# Patient Record
Sex: Male | Born: 2000 | Race: White | Hispanic: No | Marital: Single | Smoking: Current every day smoker
Health system: Southern US, Community
[De-identification: ages and names within clinical notes are randomized; demographics above are authoritative.]

---
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
## 2018-10-22 ENCOUNTER — Other Ambulatory Visit: Payer: Self-pay

## 2018-10-25 ENCOUNTER — Ambulatory Visit (INDEPENDENT_AMBULATORY_CARE_PROVIDER_SITE_OTHER): Payer: Medicaid Other | Admitting: Family Medicine

## 2018-10-25 ENCOUNTER — Encounter: Payer: Self-pay | Admitting: Family Medicine

## 2018-10-25 ENCOUNTER — Other Ambulatory Visit: Payer: Self-pay

## 2018-10-25 VITALS — BP 110/68 | HR 80 | Temp 98.4°F | Resp 18 | Ht 68.0 in | Wt 175.0 lb

## 2018-10-25 DIAGNOSIS — L304 Erythema intertrigo: Secondary | ICD-10-CM

## 2018-10-25 MED ORDER — CLOTRIMAZOLE-BETAMETHASONE 1-0.05 % EX CREA: 1.0000 "application " | TOPICAL_CREAM | Freq: Two times a day (BID) | CUTANEOUS | 1 refills | Status: DC

## 2018-10-25 NOTE — Progress Notes (Signed)
Subjective:    Patient ID: Richard Vargas, male    DOB: 02-Aug-2000, 18 y.o.   MRN: [id number]  HPI Patient presents today with a rash.  He has a rash in the crease between his scrotum and his medial upper thighs on both sides.  The rash is a serpiginous red rash with sharp well-demarcated borders.  There is white scale throughout.  It is very itchy.  It involves the scrotum as well as the upper medial thighs.  It does not involve the penis.  It does not spread to the abdomen.  He is tried athlete's foot cream without any benefit.  He also has a rash on the dorsum of his right forearm.  This is a poorly circumscribed patch of fine scale.  It is difficult to see the rash due to a tattoo in that area.  It is difficult to see if there is any erythema however there is no palpable warmth.  He is tried cortisone cream with no relief in that area.  It appeared around the same time that the rash in his groin appeared. No past medical history on file. No past surgical history on file. No current outpatient medications on file prior to visit.   No current facility-administered medications on file prior to visit.    No Known Allergies Social History   Socioeconomic History  . Marital status: Single    Spouse name: Not on file  . Number of children: Not on file  . Years of education: Not on file  . Highest education level: Not on file  Occupational History  . Not on file  Social Needs  . Financial resource strain: Not on file  . Food insecurity    Worry: Not on file    Inability: Not on file  . Transportation needs    Medical: Not on file    Non-medical: Not on file  Tobacco Use  . Smoking status: Current Every Day Smoker    Packs/day: 0.50    Types: Cigarettes  . Smokeless tobacco: Never Used  Substance and Sexual Activity  . Alcohol use: Yes    Comment: occasionally   . Drug use: Yes  . Sexual activity: Never  Lifestyle  . Physical activity    Days per week: Not on file    Minutes  per session: Not on file  . Stress: Not on file  Relationships  . Social Herbalist on phone: Not on file    Gets together: Not on file    Attends religious service: Not on file    Active member of club or organization: Not on file    Attends meetings of clubs or organizations: Not on file    Relationship status: Not on file  . Intimate partner violence    Fear of current or ex partner: Not on file    Emotionally abused: Not on file    Physically abused: Not on file    Forced sexual activity: Not on file  Other Topics Concern  . Not on file  Social History Narrative  . Not on file      Review of Systems  All other systems reviewed and are negative.      Objective:   Physical Exam Vitals signs reviewed.  Cardiovascular:     Rate and Rhythm: Normal rate and regular rhythm.     Heart sounds: Normal heart sounds.  Pulmonary:     Effort: Pulmonary effort is normal.  Breath sounds: Normal breath sounds.  Genitourinary:   Skin:    Findings: Erythema and rash present. Rash is macular and scaling.       Neurological:     Mental Status: He is alert.           Assessment & Plan:  Intertrigo  Patient appears to have Candida intertrigo in his perineal area.  Recommended Lotrisone cream twice daily for 14 days.  Consider oral Diflucan if no better.  Rash on the dorsum of his right forearm is difficult to determine.  Could be ringworm versus eczema however tattoo obscures the rash to the extent is difficult to see.  Therefore I recommended trying Lotrisone cream on this as well.  Reassess in 2 to 3 weeks

## 2019-04-13 ENCOUNTER — Ambulatory Visit (INDEPENDENT_AMBULATORY_CARE_PROVIDER_SITE_OTHER): Payer: Medicaid Other | Admitting: Nurse Practitioner

## 2019-04-13 ENCOUNTER — Encounter: Payer: Self-pay | Admitting: Nurse Practitioner

## 2019-04-13 ENCOUNTER — Other Ambulatory Visit: Payer: Self-pay

## 2019-04-13 VITALS — BP 144/68 | HR 81 | Temp 97.9°F | Resp 18 | Ht 68.0 in | Wt 161.2 lb

## 2019-04-13 DIAGNOSIS — K029 Dental caries, unspecified: Secondary | ICD-10-CM | POA: Insufficient documentation

## 2019-04-13 HISTORY — DX: Dental caries, unspecified: K02.9

## 2019-04-13 MED ORDER — IBUPROFEN 600 MG PO TABS: 600.0000 mg | ORAL_TABLET | Freq: Three times a day (TID) | ORAL | 0 refills | Status: AC | PRN

## 2019-04-13 NOTE — Progress Notes (Signed)
Acute Office Visit  Subjective:    Patient ID: Richard Vargas, male    DOB: [birth year], 20 y.o.   MRN: [id number]  Chief Complaint  Patient presents with  . Dental Pain    wisdom teeth on L side, top and bottom, started x2 weeks    HPI Patient is a 19 year old male presenting to the clinic for discomfort to his back upper molars bilaterally. He states that he thinks that his molars are erupting. He has tried no treatment. He reports attempting to call his dentist many occasions and cannot get an answer. No fever/chills/facila pain or swelling.   Social History   Socioeconomic History  . Marital status: Single    Spouse name: Not on file  . Number of children: Not on file  . Years of education: Not on file  . Highest education level: Not on file  Occupational History  . Not on file  Tobacco Use  . Smoking status: Current Every Day Smoker    Packs/day: 0.50    Types: Cigarettes  . Smokeless tobacco: Never Used  Substance and Sexual Activity  . Alcohol use: Yes    Comment: occasionally   . Drug use: Yes  . Sexual activity: Never  Other Topics Concern  . Not on file  Social History Narrative  . Not on file   Social Determinants of Health   Financial Resource Strain:   . Difficulty of Paying Living Expenses:   Food Insecurity:   . Worried About Programme researcher, broadcasting/film/video in the Last Year:   . Barista in the Last Year:   Transportation Needs:   . Freight forwarder (Medical):   Marland Kitchen Lack of Transportation (Non-Medical):   Physical Activity:   . Days of Exercise per Week:   . Minutes of Exercise per Session:   Stress:   . Feeling of Stress :   Social Connections:   . Frequency of Communication with Friends and Family:   . Frequency of Social Gatherings with Friends and Family:   . Attends Religious Services:   . Active Member of Clubs or Organizations:   . Attends Banker Meetings:   Marland Kitchen Marital Status:   Intimate Partner Violence:   . Fear of  Current or Ex-Partner:   . Emotionally Abused:   Marland Kitchen Physically Abused:   . Sexually Abused:     Outpatient Medications Prior to Visit  Medication Sig Dispense Refill  . clotrimazole-betamethasone (LOTRISONE) cream Apply 1 application topically 2 (two) times daily. 45 g 1   No facility-administered medications prior to visit.    No Known Allergies  Review of Systems  All other systems reviewed and are negative.      Objective:    Physical Exam HENT:     Mouth/Throat:     Lips: Pink.     Mouth: Mucous membranes are moist. No oral lesions.     Dentition: Gingival swelling and dental caries present. No dental abscesses.      BP (!) 144/68 (BP Location: Left Arm, Patient Position: Sitting, Cuff Size: Normal)   Pulse 81   Temp 97.9 F (36.6 C) (Temporal)   Resp 18   Ht 5\' 8"  (1.727 m)   Wt 161 lb 3.2 oz (73.1 kg)   SpO2 98%   BMI 24.51 kg/m  Wt Readings from Last 3 Encounters:  04/13/19 161 lb 3.2 oz (73.1 kg)  10/25/18 175 lb (79.4 kg)  08/11/17 178 lb (80.7 kg) (82 %,  Z= 0.90)*   * Growth percentiles are based on CDC (Boys, 2-20 Years) data.    Health Maintenance Due  Topic Date Due  . HIV Screening  Never done  . TETANUS/TDAP  Never done  . INFLUENZA VACCINE  Never done      Assessment & Plan:  Dentist Appt provided, you should make the appointment as you have dental caries  May take Ibuprofen or tylenol  Mouth wash may aid in relieving discomfort Educational printout provided  Problem List Items Addressed This Visit    None    Visit Diagnoses    Dental caries    -  Primary   Relevant Medications   ibuprofen (ADVIL) 600 MG tablet      Meds ordered this encounter  Medications  . ibuprofen (ADVIL) 600 MG tablet    Sig: Take 1 tablet (600 mg total) by mouth every 8 (eight) hours as needed.    Dispense:  30 tablet    Refill:  0   Follow Up: as needed for no resolving or worsening symptoms  Annie Main, FNP

## 2019-07-04 IMAGING — DX DG HAND COMPLETE 3+V*R*
3 series · 3 of 3 positions shown · non-contrast
Comparison: None.

CLINICAL DATA: Fourth and fifth ray pain after punching a tree last
night.

EXAM:
RIGHT HAND - COMPLETE 3+ VIEW

[hand pa]
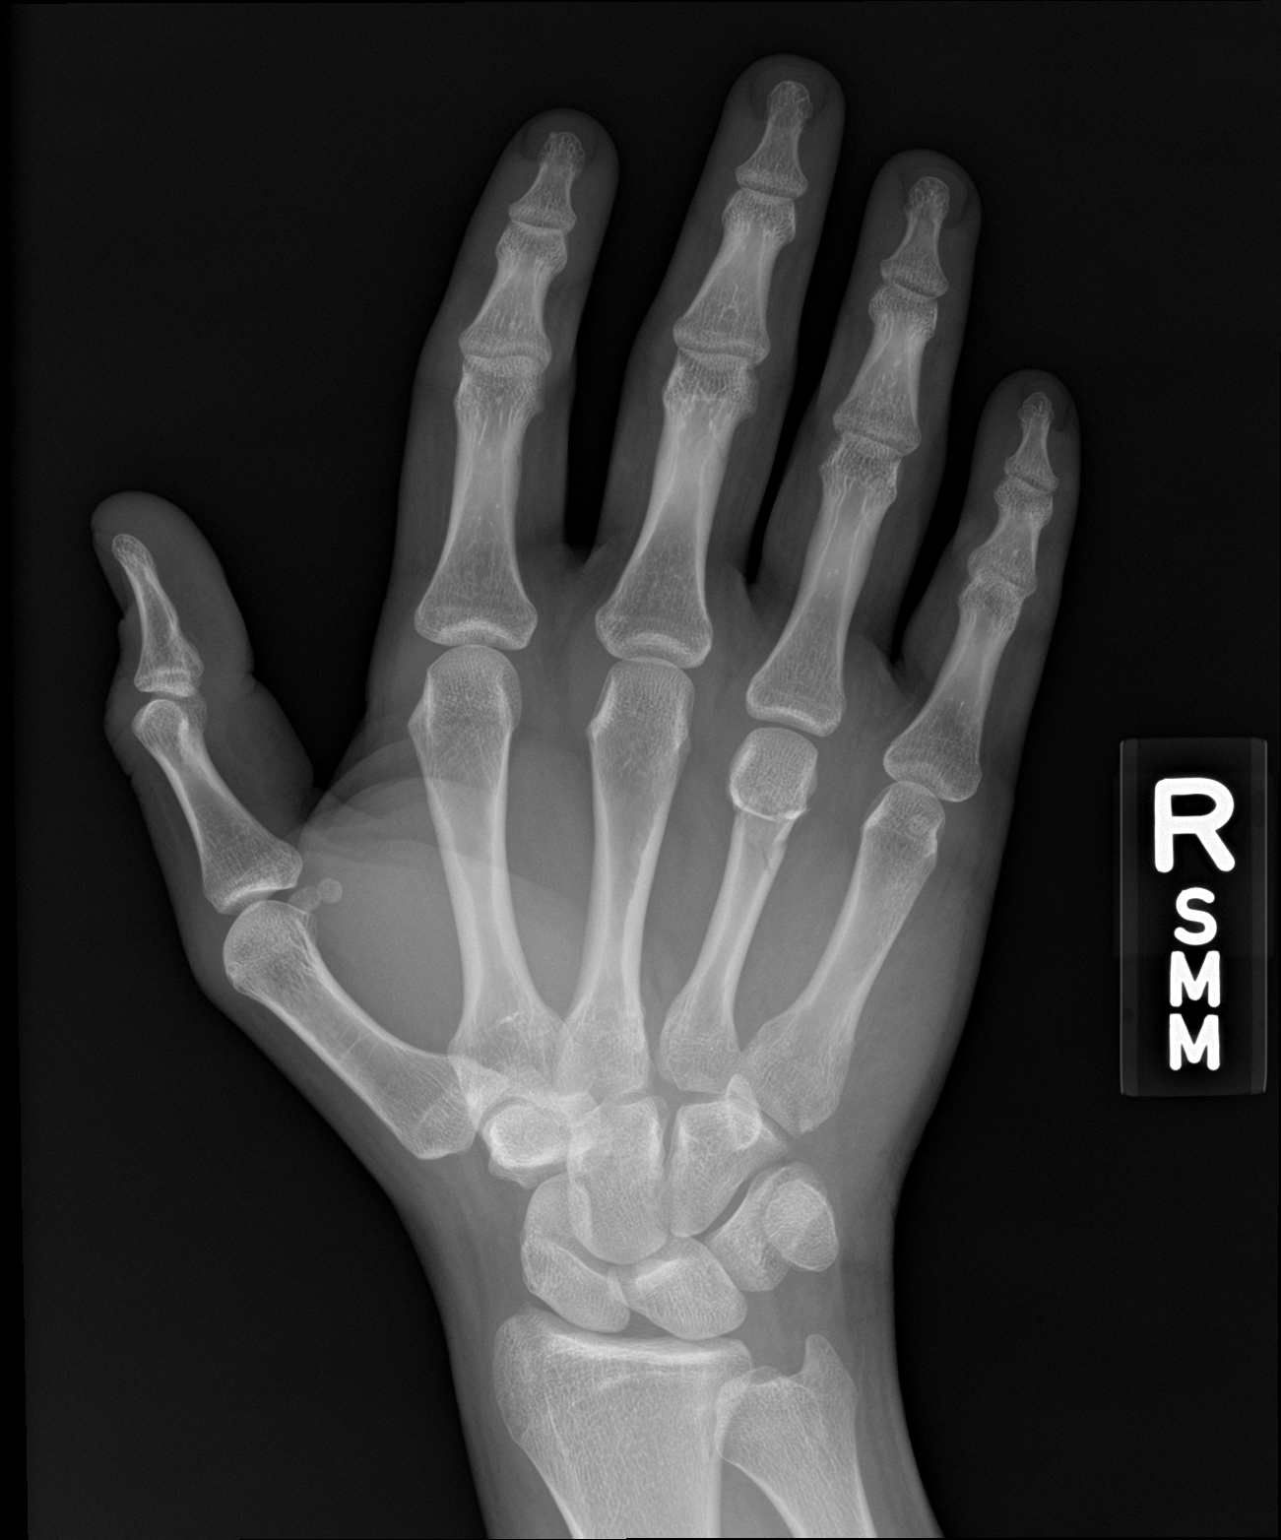

[hand obl]
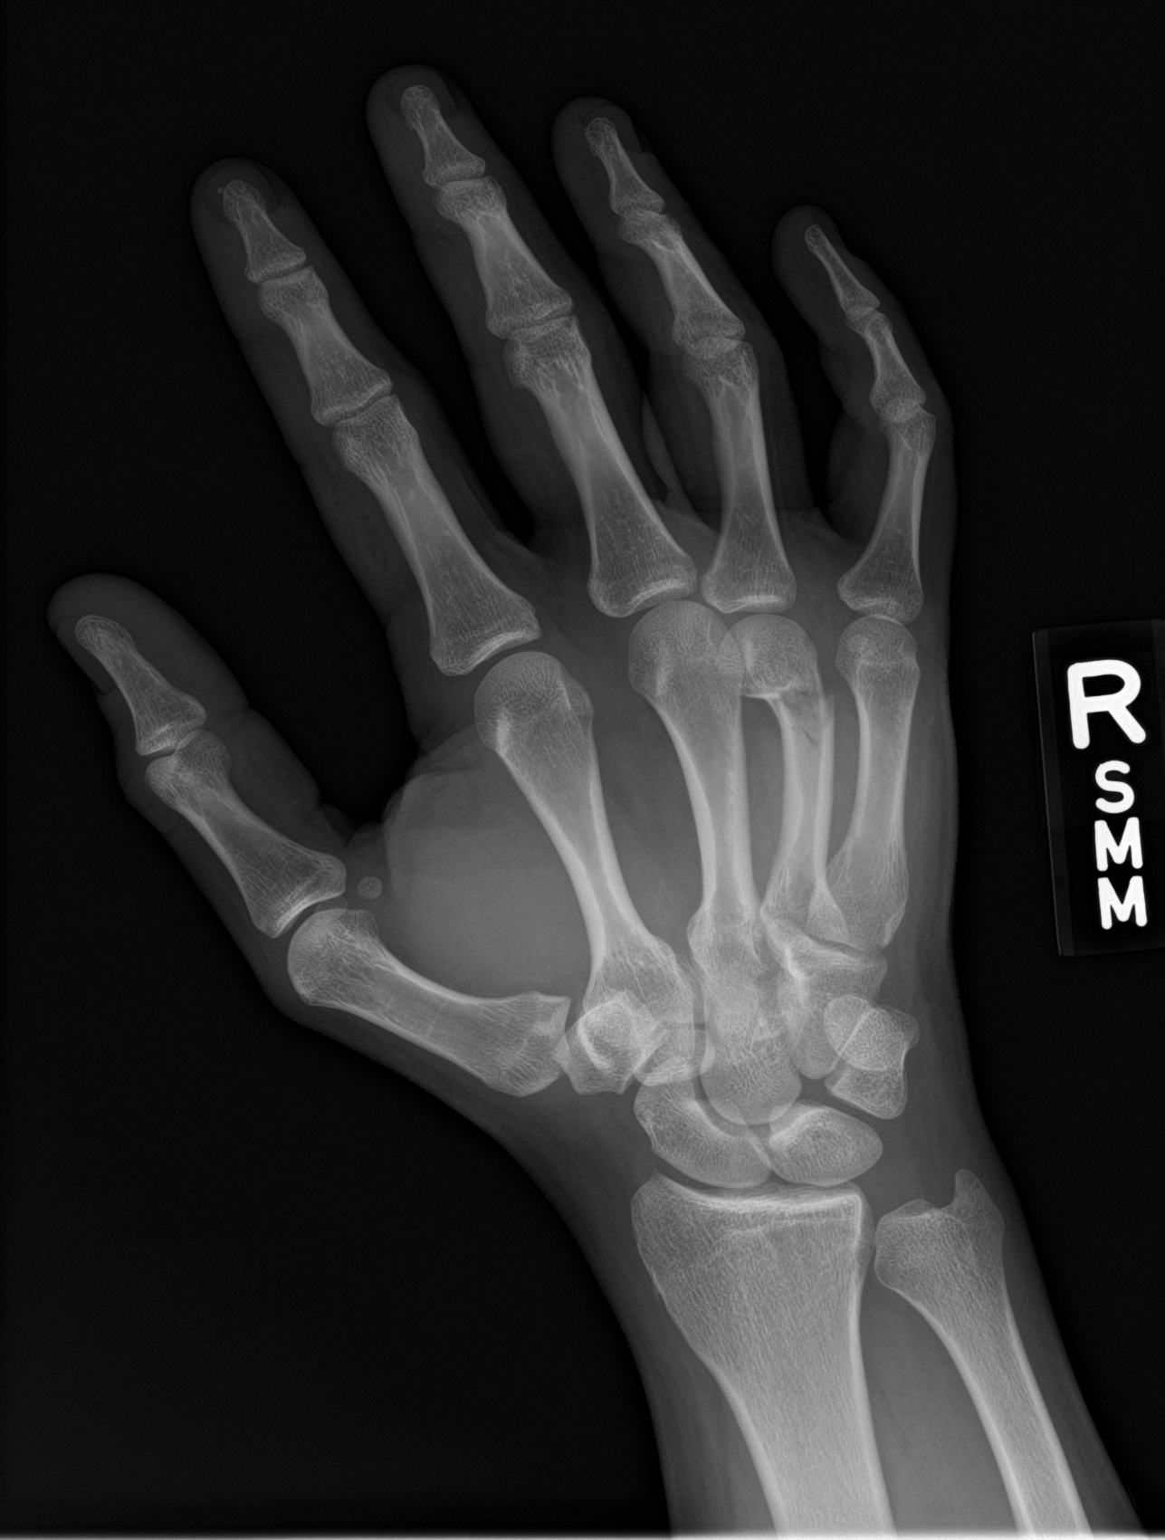

[hand lat]
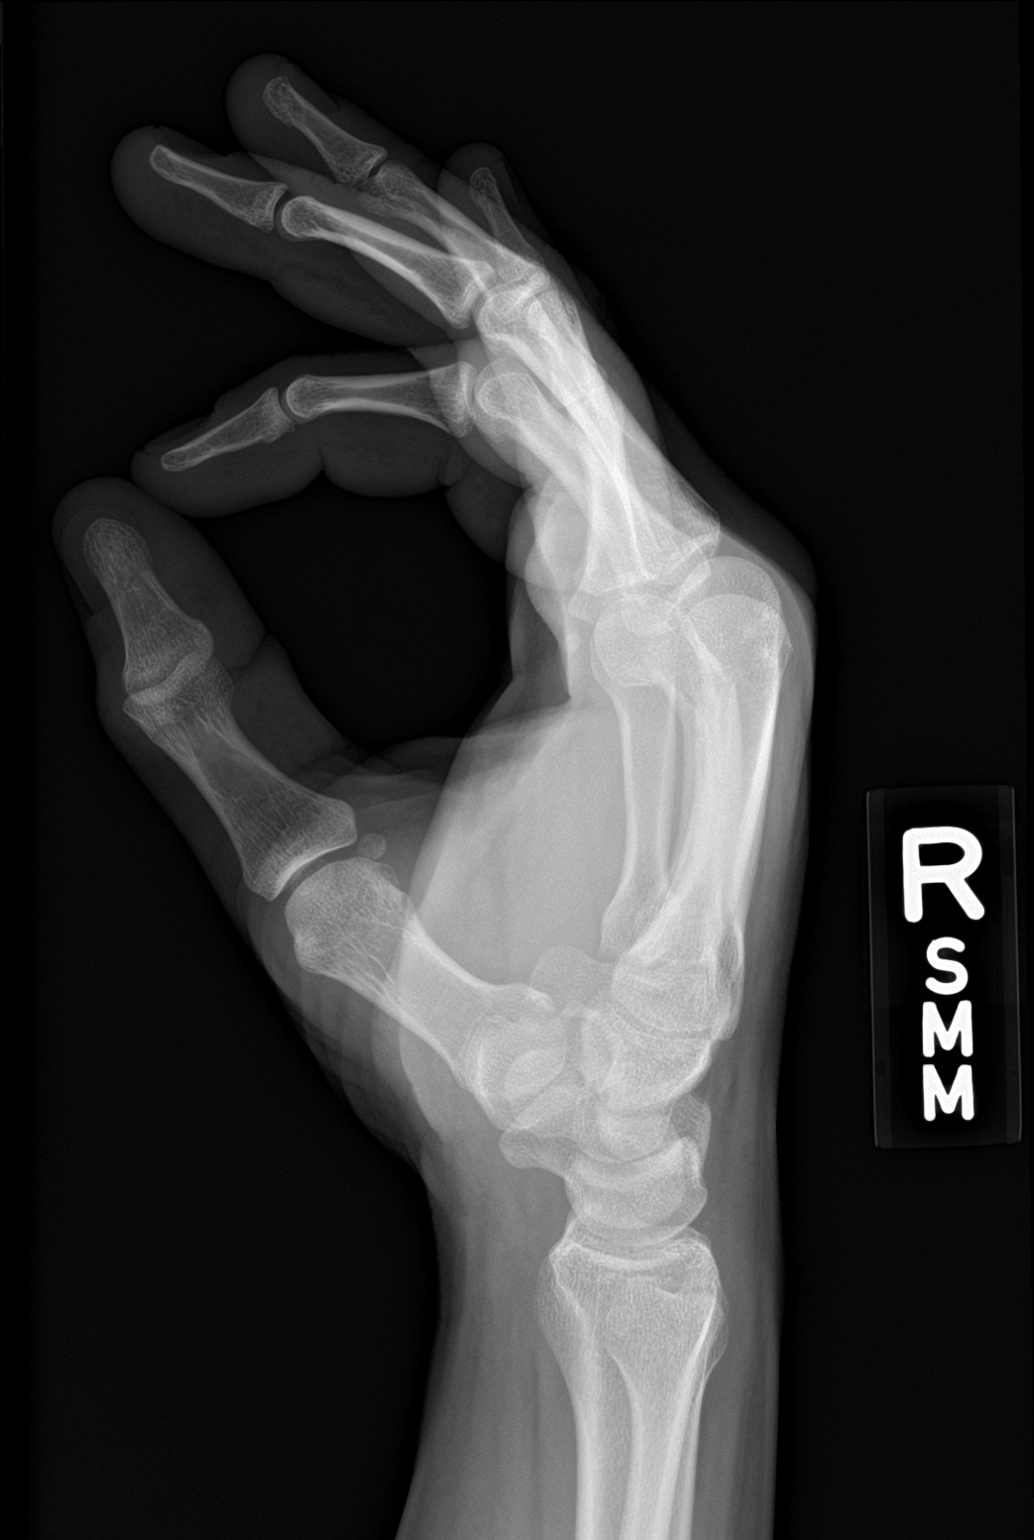

[3 of 3 positions shown; findings below may reference images not displayed]

FINDINGS: Slightly impacted comminuted non articular distal right fourth
metacarpal fracture with mild apex dorsal angulation and without
displacement. No additional fracture. No dislocation. No suspicious
focal osseous lesion. No significant arthropathy. No radiopaque
foreign body.
IMPRESSION: Comminuted non articular distal right fourth metacarpal fracture as
detailed.

## 2021-06-03 DIAGNOSIS — W228XXA Striking against or struck by other objects, initial encounter: Secondary | ICD-10-CM | POA: Diagnosis not present

## 2021-06-03 DIAGNOSIS — S0501XA Injury of conjunctiva and corneal abrasion without foreign body, right eye, initial encounter: Secondary | ICD-10-CM | POA: Diagnosis not present

## 2021-06-04 DIAGNOSIS — H168 Other keratitis: Secondary | ICD-10-CM | POA: Diagnosis not present
